# Patient Record
Sex: Female | Born: 1995 | Hispanic: Yes | Marital: Single | Smoking: Never smoker
Health system: Southern US, Community
[De-identification: ages and names within clinical notes are randomized; demographics above are authoritative.]

## PROBLEM LIST (undated history)

## (undated) ENCOUNTER — Inpatient Hospital Stay: Payer: Self-pay

## (undated) DIAGNOSIS — M419 Scoliosis, unspecified: Secondary | ICD-10-CM

## (undated) DIAGNOSIS — J45909 Unspecified asthma, uncomplicated: Secondary | ICD-10-CM

## (undated) HISTORY — PX: HEMORROIDECTOMY: SUR656

---
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
## 2016-03-18 ENCOUNTER — Other Ambulatory Visit (HOSPITAL_COMMUNITY): Payer: Self-pay | Admitting: Family Medicine

## 2016-03-18 ENCOUNTER — Other Ambulatory Visit: Payer: Self-pay | Admitting: Family Medicine

## 2016-03-18 DIAGNOSIS — Z3201 Encounter for pregnancy test, result positive: Secondary | ICD-10-CM

## 2016-03-29 ENCOUNTER — Inpatient Hospital Stay: Admission: RE | Admit: 2016-03-29 | Payer: Medicaid Other | Source: Ambulatory Visit

## 2016-03-29 ENCOUNTER — Ambulatory Visit
Admission: RE | Admit: 2016-03-29 | Discharge: 2016-03-29 | Disposition: A | Discharge status: Home / Self Care | Payer: Medicaid Other | Source: Ambulatory Visit | Attending: Family Medicine | Admitting: Family Medicine

## 2016-03-29 ENCOUNTER — Other Ambulatory Visit: Payer: Self-pay | Admitting: Family Medicine

## 2016-03-29 DIAGNOSIS — Z3687 Encounter for antenatal screening for uncertain dates: Secondary | ICD-10-CM | POA: Diagnosis not present

## 2016-03-29 DIAGNOSIS — Z3201 Encounter for pregnancy test, result positive: Secondary | ICD-10-CM | POA: Diagnosis present

## 2016-05-18 ENCOUNTER — Emergency Department: Payer: Medicaid Other

## 2016-05-18 ENCOUNTER — Emergency Department
Admission: EM | Admit: 2016-05-18 | Discharge: 2016-05-18 | Disposition: A | Discharge status: Home / Self Care | Payer: Medicaid Other | Attending: Student in an Organized Health Care Education/Training Program | Admitting: Student in an Organized Health Care Education/Training Program

## 2016-05-18 ENCOUNTER — Encounter: Payer: Self-pay | Admitting: Emergency Medicine

## 2016-05-18 DIAGNOSIS — R1030 Lower abdominal pain, unspecified: Secondary | ICD-10-CM | POA: Diagnosis not present

## 2016-05-18 DIAGNOSIS — M545 Low back pain, unspecified: Secondary | ICD-10-CM

## 2016-05-18 DIAGNOSIS — O26892 Other specified pregnancy related conditions, second trimester: Secondary | ICD-10-CM | POA: Diagnosis not present

## 2016-05-18 DIAGNOSIS — Z3A15 15 weeks gestation of pregnancy: Secondary | ICD-10-CM | POA: Diagnosis not present

## 2016-05-18 DIAGNOSIS — J45909 Unspecified asthma, uncomplicated: Secondary | ICD-10-CM | POA: Diagnosis not present

## 2016-05-18 DIAGNOSIS — R1031 Right lower quadrant pain: Secondary | ICD-10-CM

## 2016-05-18 DIAGNOSIS — R1032 Left lower quadrant pain: Secondary | ICD-10-CM

## 2016-05-18 DIAGNOSIS — R102 Pelvic and perineal pain: Secondary | ICD-10-CM

## 2016-05-18 LAB — BASIC METABOLIC PANEL
Anion gap: 6 (ref 5–15)
BUN: 8 mg/dL (ref 6–20)
CALCIUM: 8.9 mg/dL (ref 8.9–10.3)
CO2: 24 mmol/L (ref 22–32)
Chloride: 107 mmol/L (ref 101–111)
Creatinine, Ser: 0.38 mg/dL — ABNORMAL LOW (ref 0.44–1.00)
Glucose, Bld: 100 mg/dL — ABNORMAL HIGH (ref 65–99)
POTASSIUM: 3.6 mmol/L (ref 3.5–5.1)
Sodium: 137 mmol/L (ref 135–145)

## 2016-05-18 LAB — URINALYSIS, COMPLETE (UACMP) WITH MICROSCOPIC
Bilirubin Urine: NEGATIVE
Glucose, UA: NEGATIVE mg/dL
HGB URINE DIPSTICK: NEGATIVE
Ketones, ur: NEGATIVE mg/dL
NITRITE: NEGATIVE
PH: 7 (ref 5.0–8.0)
Protein, ur: NEGATIVE mg/dL
SPECIFIC GRAVITY, URINE: 1.004 — AB (ref 1.005–1.030)

## 2016-05-18 LAB — CBC WITH DIFFERENTIAL/PLATELET
BASOS ABS: 0.1 10*3/uL (ref 0–0.1)
BASOS PCT: 1 %
Eosinophils Absolute: 0.1 10*3/uL (ref 0–0.7)
Eosinophils Relative: 1 %
HEMATOCRIT: 35.4 % (ref 35.0–47.0)
Hemoglobin: 11.9 g/dL — ABNORMAL LOW (ref 12.0–16.0)
Lymphocytes Relative: 22 %
Lymphs Abs: 2.4 10*3/uL (ref 1.0–3.6)
MCH: 28.7 pg (ref 26.0–34.0)
MCHC: 33.7 g/dL (ref 32.0–36.0)
MCV: 85.3 fL (ref 80.0–100.0)
MONO ABS: 0.7 10*3/uL (ref 0.2–0.9)
Monocytes Relative: 6 %
NEUTROS ABS: 7.4 10*3/uL — AB (ref 1.4–6.5)
Neutrophils Relative %: 70 %
PLATELETS: 187 10*3/uL (ref 150–440)
RBC: 4.14 MIL/uL (ref 3.80–5.20)
RDW: 15 % — ABNORMAL HIGH (ref 11.5–14.5)
WBC: 10.6 10*3/uL (ref 3.6–11.0)

## 2016-05-18 LAB — ABO/RH: ABO/RH(D): A POS

## 2016-05-18 MED ORDER — NITROFURANTOIN MONOHYD MACRO 100 MG PO CAPS: 100.0000 mg | ORAL_CAPSULE | Freq: Two times a day (BID) | ORAL | 0 refills | Status: AC

## 2016-05-18 MED ORDER — ACETAMINOPHEN 500 MG PO TABS
1000.0000 mg | ORAL_TABLET | Freq: Once | ORAL | Status: AC
  Administered 2016-05-18: 1000 mg via ORAL
  Filled 2016-05-18: qty 2

## 2016-05-18 NOTE — ED Notes (Addendum)
Pt back to room. Pt ambulatory and up to restroom at this time. No acute distress at this time.

## 2016-05-18 NOTE — ED Notes (Signed)
Pt taken to radiology. Pt then taken from radiology to ultrasound. RN unable to draw blood work before pt taken from floor.
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
## 2016-05-18 NOTE — ED Notes (Signed)
Pt able to ambulate without difficulty and without needed assistance. Pt verbalized feeling pressure in lower back but denies pain or abd pain at this time. Pt currently drinking fluids per MD order.

## 2016-05-18 NOTE — ED Notes (Signed)
Pt verbalized understanding of discharge and reports having called a ride. Pt able to ambulate independently and verbalized she would wait for ride in lobby. Pt in NAD at time of discharge.

## 2016-05-18 NOTE — ED Notes (Signed)
MD at bedside with ultrasound

## 2016-05-27 ENCOUNTER — Other Ambulatory Visit: Payer: Self-pay | Admitting: Family Medicine

## 2016-05-27 DIAGNOSIS — Z3689 Encounter for other specified antenatal screening: Secondary | ICD-10-CM

## 2016-06-02 ENCOUNTER — Ambulatory Visit
Admission: RE | Admit: 2016-06-02 | Discharge: 2016-06-02 | Disposition: A | Discharge status: Home / Self Care | Payer: Medicaid Other | Source: Ambulatory Visit | Attending: Family Medicine | Admitting: Family Medicine

## 2016-06-02 ENCOUNTER — Ambulatory Visit
Admission: RE | Admit: 2016-06-02 | Discharge: 2016-06-02 | Disposition: A | Discharge status: Home / Self Care | Payer: Medicaid Other | Source: Ambulatory Visit | Attending: Obstetrics & Gynecology | Admitting: Obstetrics & Gynecology

## 2016-06-02 ENCOUNTER — Other Ambulatory Visit: Payer: Self-pay | Admitting: *Deleted

## 2016-06-02 ENCOUNTER — Encounter: Payer: Self-pay | Admitting: *Deleted

## 2016-06-02 DIAGNOSIS — Z3689 Encounter for other specified antenatal screening: Secondary | ICD-10-CM | POA: Diagnosis not present

## 2016-06-02 DIAGNOSIS — Z3A17 17 weeks gestation of pregnancy: Secondary | ICD-10-CM | POA: Diagnosis not present

## 2016-06-02 DIAGNOSIS — Z36 Encounter for antenatal screening for chromosomal anomalies: Secondary | ICD-10-CM | POA: Diagnosis not present

## 2016-06-02 NOTE — Progress Notes (Addendum)
Referring Provider:  [facility] Length of Consultation: 40 minutes   Mary Kelley was referred to [facility] for genetic counseling and targeted ultrasound because of an increased risk for Down syndrome by the maternal serum prenatal screen.  This note summarizes the information we discussed.   We provided background information on the maternal serum prenatal screen.  It was explained that this is a maternal blood test performed between the 14th and 21st week of pregnancy which measures several pregnancy proteins.  The levels of these proteins can help determine if a pregnancy is at high risk for certain birth defects or problems.  However, it cannot diagnose or rule out these defects.  An abnormal maternal serum screen does not necessarily mean that the baby has a problem.  Maternal serum screening can identify approximately 80% of neural tube defects, up to 75% of Down syndrome cases and 60% of trisomy 18 cases.  The neural tube consists of the fetal head and spine and if this structure fails to close during development, spina bifida (open spine) or anencephaly (open skull) could result.  Chromosomes are the inherited structures that contain our instructions for development (genes).  Each cell in our body normally has 46 chromosomes.  Rarely, when an egg and sperm unite, an extra or missing chromosome can be passed on to the baby by mistake.  These types of chromosome problems typically cause mental retardation and might also cause birth defects.  We discussed Down syndrome (an extra chromosome #21) and trisomy 52 (an extra chromosome #18).    The maternal serum screen revealed protein levels that increased the chance of Down syndrome (trisomy 21) in the pregnancy.  Before maternal serum screening, the age-related chance of Trisomy 21 in the pregnancy was 1 in 1,180.  Given the maternal serum screen results, the chance is now estimated to be 1 in 59.  This  means that the chance the baby does not have Down syndrome is greater than 98 percent.  The following prenatal testing options for this pregnancy:  Targeted ultrasound uses high frequency sound waves to create an image of the developing fetus.  An ultrasound is often recommended as a routine means of evaluating the pregnancy.  It is also used to screen for fetal anatomy problems (for example, a heart defect) that might be suggestive of a chromosomal or other abnormality.    Amniocentesis involves the removal of a small amount of amniotic fluid from the sac surrounding the fetus with the use of a thin needle inserted through the maternal abdomen and uterus.  Ultrasound guidance is used throughout the procedure.  Fetal cells are directly evaluated and > 98% of neural tube defects can be detected.  The main risks to this procedure include complications leading to miscarriage in less than 1 in 200 cases (0.5%).    We also reviewed the availability of cell free fetal DNA testing from maternal blood to determine whether or not the baby may have Down syndrome, trisomy 44, or trisomy 64.  This test utilizes a maternal blood sample and DNA sequencing technology to isolate circulating cell free fetal DNA from maternal plasma.  The fetal DNA can then be analyzed for DNA sequences that are derived from the three most common chromosomes involved in aneuploidy, chromosomes 13, 18, and 21.  If the overall amount of DNA is greater than the expected level for any of these chromosomes, aneuploidy is suspected.  While we do not consider it a replacement  for invasive testing and karyotype analysis, a negative result from this testing would be reassuring, though not a guarantee of a normal chromosome complement for the baby.  An abnormal result is certainly suggestive of an abnormal chromosome complement, though we would still recommend CVS or amniocentesis to confirm any findings from this testing.  Cystic Fibrosis and Spinal  Muscular Atrophy (SMA) screening were also discussed with the patient. Both conditions are recessive, which means that both parents must be carriers in order to have a child with the disease.  Cystic fibrosis (CF) is one of the most common genetic conditions in persons of Caucasian ancestry.  This condition occurs in approximately 1 in 2,500 Caucasian persons and results in thickened secretions in the lungs, digestive, and reproductive systems.  For a baby to be at risk for having CF, both of the parents must be carriers for this condition.  Approximately 1 in 2025 Caucasian persons is a carrier for CF.  Current carrier testing looks for the most common mutations in the gene for CF and can detect approximately 90% of carriers in the Caucasian population.  This means that the carrier screening can greatly reduce, but cannot eliminate, the chance for an individual to have a child with CF.  If an individual is found to be a carrier for CF, then carrier testing would be available for the partner. As part of [place] newborn screening profile, all babies born in the state of [place]  will have a two-tier screening process.  Specimens are first tested to determine the concentration of immunoreactive trypsinogen (IRT).  The top 5% of specimens with the highest IRT values then undergo DNA testing using a panel of over 40 common CF mutations. SMA is a neurodegenerative disorder that leads to atrophy of skeletal muscle and overall weakness.  This condition is also more prevalent in the Caucasian population, with 1 in 40-1 in 60 persons being a carrier and 1 in 6,000-1 in 10,000 children being affected.  There are multiple forms of the disease, with some causing death in infancy to other forms with survival into adulthood.  The genetics of SMA is complex, but carrier screening can detect up to 95% of carriers in the Caucasian population.  Similar to CF, a negative result can greatly reduce, but cannot eliminate,  the chance to have a child with SMA.  We obtained a detailed family history and pregnancy history.  The family history is unremarkable for birth defects, developmental delays, recurrent pregnancy loss or known chromosome abnormalities.  Mary Kelley stated that this is her second pregnancy.  She reported that she had a 13 week miscarriage in her first pregnancy, after having had lots of bleeding throughout the pregnancy.  No genetic testing was performed in that pregnancy.  In the current pregnancy, she reported no complications or exposures other than alcohol use prior to learning that she was pregnant.  She stated that she was drinking about 5 beers one day per week.  Alcohol consumption during pregnancy has been associated with a number of birth defects including growth delays, small head size, heart defects, eye anomalies and facial differences as well as learning disabilities and behavioral problems.  The risk of these to occur tends to increase with the amount of alcohol consumed, however, malformations have been seen with as little as two drinks per day.  Because there is no safe amount of alcohol consumption during pregnancy, we suggest women completely avoid alcohol while pregnant. Because she stated that she stopped  very early in the pregnancy, around 4 weeks, it was likely that this exposure was during the all or none period and would have a low risk for fetal effects.  After consideration of the options, Mary Kelley elected to proceed with cell free fetal DNA testing and a detailed ultrasound.    She declined CF and SMA carrier screening.  An ultrasound was performed at the time of the visit.  Dating was consistent with 17 weeks.  The detailed fetal anatomy was seen and appeared normal except for the heart and spine, which were suboptimally seen.  She will return in 3 weeks to complete the anatomy.  We would also recommend a third trimester ultrasound for growth, as the hCG level was greater  than 2.5 MoM.  The patient was encouraged to call with questions or concerns. We can be contacted at [phone].    Mary Anderson, MS, CGC  Mariaeduarda Defranco, [place] A, MD

## 2016-06-09 ENCOUNTER — Ambulatory Visit: Payer: No Typology Code available for payment source

## 2016-06-10 LAB — INFORMASEQ(SM) WITH XY ANALYSIS
Fetal Fraction (%):: 11.1
GESTATIONAL AGE AT COLLECTION: 18 wk

## 2016-06-13 ENCOUNTER — Telehealth: Payer: Self-pay | Admitting: Obstetrics and Gynecology

## 2016-06-13 NOTE — Telephone Encounter (Signed)
Ms. Mary Kelley elected to have InformaSeq cell free DNA testing on 06/02/16 due to an abnormal maternal serum screening result with a 1 in 59 chance for Down syndrome.  The patient was informed of the results of her recent InformaSeq testing (performed at [facility]) which yielded NEGATIVE results.  The patient's specimen showed DNA consistent with two copies of chromosomes 21, 18 and 13.  The sensitivity for trisomy 5521, trisomy 6518 and trisomy 4013 using this testing are reported as 99.1%, 98.3% and 98.1% respectively.  Thus, while the results of this testing are highly accurate, they are not considered diagnostic at this time.  Should more definitive information be desired, the patient may still consider amniocentesis.   As requested to know by the patient, sex chromosome analysis was included for this sample.  Results were consistent with a female fetus (XY). This is predicted with >97% accuracy.  Because the hCG was greater than 2.5 MoM, we would recommend an ultrasound for growth in the third trimester.  Cherly Andersoneborah F. Tzivia Oneil, MS, CGC

## 2016-06-20 ENCOUNTER — Other Ambulatory Visit: Payer: Medicaid Other

## 2016-06-20 ENCOUNTER — Other Ambulatory Visit: Payer: Self-pay

## 2016-06-20 DIAGNOSIS — IMO0002 Reserved for concepts with insufficient information to code with codable children: Secondary | ICD-10-CM

## 2016-06-20 DIAGNOSIS — Z0489 Encounter for examination and observation for other specified reasons: Secondary | ICD-10-CM

## 2016-06-23 ENCOUNTER — Ambulatory Visit
Admission: RE | Admit: 2016-06-23 | Discharge: 2016-06-23 | Disposition: A | Discharge status: Home / Self Care | Payer: Medicaid Other | Source: Ambulatory Visit | Attending: Maternal and Fetal Medicine | Admitting: Maternal and Fetal Medicine

## 2016-06-23 DIAGNOSIS — IMO0002 Reserved for concepts with insufficient information to code with codable children: Secondary | ICD-10-CM

## 2016-06-23 DIAGNOSIS — Z362 Encounter for other antenatal screening follow-up: Secondary | ICD-10-CM | POA: Diagnosis present

## 2016-06-23 DIAGNOSIS — Z0489 Encounter for examination and observation for other specified reasons: Secondary | ICD-10-CM

## 2016-08-08 ENCOUNTER — Observation Stay
Admission: EM | Admit: 2016-08-08 | Discharge: 2016-08-08 | Disposition: A | Discharge status: Home / Self Care | Payer: Medicaid Other | Attending: Obstetrics & Gynecology | Admitting: Obstetrics & Gynecology

## 2016-08-08 DIAGNOSIS — O368120 Decreased fetal movements, second trimester, not applicable or unspecified: Principal | ICD-10-CM | POA: Insufficient documentation

## 2016-08-08 DIAGNOSIS — Z3A26 26 weeks gestation of pregnancy: Secondary | ICD-10-CM | POA: Insufficient documentation

## 2016-08-08 NOTE — Procedures (Addendum)
Bedside ultrasound:  21yo G2P0010 @ 26w+4d with history of 17week loss with decreased fetal movement x <12hours. LMP: 02/03/16 EDC: 11/09/16  Ultrasound findings:  Fundal/posterior placenta, intact, distinct border to uterus. Grossly normal fluid, with DVP 6cm, more than one 2x2pocket. Cephalic presentation Normal appearing cardiac motion  During exam, there was >3 gross movements and flexion/extension, as well as >30sec continuous breathing motion throughout.  BPP result:  8/8 sonographic findings.   Patient did not register all fetal movements.  ----- Mary Plumberhelsea Miski Feldpausch, MD Attending Obstetrician and Gynecologist [facility], Department of OB/GYN [facility]

## 2016-08-08 NOTE — Discharge Summary (Signed)
Mary SladeDalia Bradly Kelley is a 21 y.o. female. She is at 6530w4d gestation. Patient's last menstrual period was 02/03/2016. Estimated Date of Delivery: 11/09/16  [facility]  Chief complaint: decreased fetal movement  Location: gravid uterus Onset/timing: 8am today  Duration: 10 hours Quality:  Decreased movement Severity: moderate Aggravating or alleviating conditions: n/a Associated signs/symptoms: no CTX, no VB.no LOF Context:  Patient has history of 17week loss and is anxious about this pregnancy.  She had been feeling normal, routine, and predictable movements, which have not disappeared, but are not typical today.  Denies trauma, any illness, loss of fluid.   Maternal Medical History:   Past Medical History:  Diagnosis Date  . Asthma     No past surgical history on file.  No Known Allergies  Prior to Admission medications   Medication Sig Start Date End Date Taking? Authorizing Provider  Prenatal Vit-Fe Fumarate-FA (MULTIVITAMIN-PRENATAL) 27-0.8 MG TABS tablet Take 1 tablet by mouth daily at 12 noon.    [provider]     Social History: She  reports that she has never smoked. She has never used smokeless tobacco. She reports that she does not drink alcohol or use drugs.  Family History: no history of gyn cancers  Review of Systems: A full review of systems was performed and negative except as noted in the HPI.     O:  Temp 98.6 F (37 C) (Oral)   Resp 18   Ht 5\' 5"  (1.651 m)   Wt 64.9 kg (143 lb)   LMP 02/03/2016   BMI 23.80 kg/m  No results found for this or any previous visit (from the past 48 hour(s)).   Constitutional: NAD, AAOx3  HE/ENT: extraocular movements grossly intact, moist mucous membranes CV: RRR PULM: nl respiratory effort, CTABL     Abd: gravid, non-tender, non-distended, soft      Ext: Non-tender, Nonedmeatous   Psych: mood appropriate, speech normal Pelvic deferred  NST:  Baseline: 140 Variability:  moderate Accelerations present x >2 (15x15) Decelerations absent Time 20mins    A/P: 21 y.o. 6630w4d here for decreased fetal movement  Labor: not present.   Fetal Wellbeing: Reassuring Cat 1 tracing.  Reactive NST, 10/10 BPP  Reassurance given.  D/c home stable, precautions reviewed, follow-up as scheduled.   ----- Ranae Plumberhelsea Ward, MD Attending Obstetrician and Gynecologist [facility], Department of OB/GYN [facility]

## 2016-08-08 NOTE — OB Triage Note (Signed)
Patient presents with c/o no fetal movement since 0800 today.  Denies any vaginal bleeding/spotting.  Denies any cramping or contractions.  No s/s srom.  Dr. Elesa MassedWard at bedside, bedside U/s performed.  Confirmed stable fhr and movement.  efm and toco appled.  Abdomen soft, non-tender.

## 2017-02-01 ENCOUNTER — Encounter (HOSPITAL_COMMUNITY): Payer: Self-pay

## 2017-02-13 ENCOUNTER — Ambulatory Visit: Payer: Self-pay | Attending: Family Medicine

## 2017-02-13 ENCOUNTER — Other Ambulatory Visit: Payer: Self-pay

## 2017-02-13 DIAGNOSIS — M62838 Other muscle spasm: Secondary | ICD-10-CM | POA: Insufficient documentation

## 2017-02-13 DIAGNOSIS — R293 Abnormal posture: Secondary | ICD-10-CM | POA: Insufficient documentation

## 2017-02-13 DIAGNOSIS — M6281 Muscle weakness (generalized): Secondary | ICD-10-CM | POA: Insufficient documentation

## 2017-02-13 NOTE — Therapy (Signed)
[place] [facility] MAIN [facility] SERVICES [address] [place], [place], [address] Phone: [phone]   Fax:  [phone]  Physical Therapy Evaluation  Patient Details  Name: Mary Kelley MRN: [id number] Date of Birth: 1995-09-11 Referring Provider: Berenda Morale   Encounter Date: 02/13/2017  PT End of Session - 02/13/17 1647    Visit Number  1    Number of Visits  4    Date for PT Re-Evaluation  04/10/17    Authorization Type  self-pay    Authorization - Visit Number  1    Authorization - Number of Visits  4    PT Start Time  1402    PT Stop Time  1504    PT Time Calculation (min)  62 min    Activity Tolerance  Patient tolerated treatment well    Behavior During Therapy  [facility] for tasks assessed/performed       Past Medical History:  Diagnosis Date  . Asthma     History reviewed. No pertinent surgical history.  There were no vitals filed for this visit.          Pelvic Floor Physical Therapy Evaluation and Assessment  SUBJECTIVE  Patient reports: Pain started when she was about 6 months pregnant (May 2018). The pain was in abdomen and pelvis. Pain is now only in the pelvis during intercourse and when standing for long durations. 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 Wanted to get an IUD but unable due to pain with use of speculum so is using depo-provera. Pain is not allowing her to have intercourse since she was ~ 6 mo. Pregnant. Has scoliosis.  Social/Family/Vocational History:   Lives with son who is 19 months old.   Obstetrical History: G2P1011 Had vaginal tearing with delivery.   Gynecological History: None relevant  Urinary History: No difficulty with urination, no pain.  Urinates every 3-4 hours Drinks ~ 3 12 oz. Water bottles per day.   Gastrointestinal History: No issues  Sexual activity/pain: Pain with initial  penetration and deep thrusting. It does not linger after intercourse  Location of pain: at opening and inside of the vagina Current pain:  0/10  Max pain:  8/10 Least pain:  0/10 Nature of pain: sharp, tight, uncomfortable   Patient Goals: "Get rid of pain."   OBJECTIVE  Posture/Observations:  Sitting: forward rounded protective posture Standing: mild lordosis and forward head.  Palpation/Segmental Motion/Joint Play: TTP through adductors on R only iliac crest high on R, L ASIS low, L facing pelvis. No TTP through paraspinals Special tests:   Stork positive for instability on L, negative on R Scoliosis forward bend: R thoracic, L lumbar curve, mild.   Range of Motion/Flexibilty:  Spine:  Hips:    Strength/MMT:  LE MMT  LE MMT Left Right  Hip flex:  (L2) /5 /5  Hip ext: 4+/5 4+/5  Hip abd: 4+/5 4+/5  Hip add: 4+/5 4+/5  Hip IR 4/5 4/5  Hip ER 4+/5 4+/5     Abdominal:  Palpation: no TTP Diastasis: 1 finger below sternum, 2 fingers at umbilicus and below.  Pelvic Floor External Exam: Introitus Appears: gaping Skin integrity: incomplete closure of the posterior fourchette after tearing with childbirth, mild bleeding (pt. Reports from depo) Palpation: TTP through all of superficial layer on R only Cough: paradoxical Prolapse visible?: no Scar mobility: unable to assess d/t pain  Internal Vaginal Exam: Strength (  PERF): 2/5 1 second hold.  Symmetry: R side with increased tone. Palpation: TTP throughout internally on R, OI only on the L Prolapse: uterus visible above level of introitus with bearing down   Gait Analysis: Not assessed   Pelvic Floor Outcome Measures: Female NIH-CPSI: 25/43 (58%)          Objective measurements completed on examination: See above findings.              PT Education - 02/13/17 1646    Education provided  Yes    Education Details  educated on the structure and function of the pelvic floor, the POC, and  breathing techniques to decrease pain and lengthent the pelvic floor.    Person(s) Educated  Patient    Methods  Explanation;Demonstration;Tactile cues;Verbal cues    Comprehension  Verbalized understanding;Returned demonstration       PT Short Term Goals - 02/13/17 0927      PT SHORT TERM GOAL #1   Title  Patient will report a reduction of pain by at least 50% to light touch to allow for self manual therapy and demonstrate decreased tissue sensitivity    Time  4    Period  Weeks    Status  New    Target Date  03/14/17      PT SHORT TERM GOAL #2   Title  Patient will demonstrate a coordinated contraction, relaxation, and bulge of the pelvic floor muscles to demonstrate functional recruitment and motion and allow for further strengthening.    Time  4    Period  Weeks    Status  New    Target Date  03/14/17      PT SHORT TERM GOAL #3   Title  Patient will demonstrate appropriate body mechanics with bending and lifting heavy objects to allow for decreased stress on the pelvic floor and low back.     Time  4    Period  Weeks    Status  New    Target Date  03/14/17        PT Long Term Goals - 02/14/17 0932      PT LONG TERM GOAL #1   Title  Patient will report no pain with intercourse to demonstrate improved functional ability.    Time  8    Period  Weeks    Status  New    Target Date  04/11/17      PT LONG TERM GOAL #2   Title  Patient will score less than or equal to 20% on the Female NIH-CPSI to demonstrate a reduction in pain, urinary symptoms, and an improved quality of life.    Baseline  58%     Time  8    Period  Weeks    Status  New    Target Date  04/11/17      PT LONG TERM GOAL #3   Title  Patient will demonstrate 3+/5 strength or better in the PF and surrounding musculature to show improved functional strength for childcare and other vocational and recreational activities.     Baseline  2/5    Time  8    Period  Weeks    Status  New    Target Date   04/11/17             Plan - 02/13/17 1649    Clinical Impression Statement  Patient is a 21 y/o female who is 3 mo. postpartum and presents with cheif c/o pelvic  pain during standing, to palpation, and with intercourse that is limiting her functional activity and does not allow for gynecological exams or treatment without intense pain. The patient will benefit from skilled physical therapy to address muscular imbalance, spasm, weakness, and poor knowledge of appropriate posture and body mechanics to decrease pain and improve her functional ability.    History and Personal Factors relevant to plan of care:  Patient gave birth on August 17th 2018 to her first child, she lives alone, works full-time and studies full-time.     Clinical Presentation  Stable    Clinical Decision Making  Moderate    Rehab Potential  Good    PT Frequency  Other (comment) bimonthly    PT Duration  8 weeks    PT Treatment/Interventions  ADLs/Self Care Home Management;Neuromuscular re-education;Gait training;Passive range of motion;Manual techniques;Functional mobility training;Moist Heat;Traction;Therapeutic activities;Patient/family education;Taping;Therapeutic exercise;Scar mobilization    PT Next Visit Plan  address hip imbalance,  functional mobility education, give postnatal handout    PT Home Exercise Plan  plank (on L), rotations to L, adductor stretch,     Consulted and Agree with Plan of Care  Patient       Patient will benefit from skilled therapeutic intervention in order to improve the following deficits and impairments:  Increased fascial restricitons, Pain, Improper body mechanics, Decreased coordination, Decreased scar mobility, Increased muscle spasms, Postural dysfunction, Decreased strength  Visit Diagnosis: Other muscle spasm  Muscle weakness (generalized)  Abnormal posture     Problem List Patient Active Problem List   Diagnosis Date Noted  . Labor and delivery indication for care or  intervention 08/08/2016  . Risk of fetal chromosome anomaly affecting antepartum care of mother   . Preterm delivery, delivered 08/05/2015  . Retained products of conception after delivery without hemorrhage 08/05/2015   Cleophus MoltKeeli T. Gailes DPT, ATC  Cleophus MoltKeeli T Gailes 02/14/2017, 10:18 AM  [facility] [address] [place], [place], [address] Phone: [phone]   Fax:  [phone]  Name: Mary PinaDalia Kelley MRN: [id number] Date of Birth: [birth year]

## 2017-02-14 NOTE — Patient Instructions (Signed)
1.) Perform deep belly-breathing to help decrease spasm and lower pain.

## 2017-03-13 ENCOUNTER — Ambulatory Visit: Payer: Self-pay

## 2017-03-27 ENCOUNTER — Ambulatory Visit: Payer: Self-pay | Attending: Family Medicine

## 2017-12-17 IMAGING — US US OB COMP LESS 14 WK
1 series · 14 of 28 positions shown · non-contrast
Comparison: Pelvic ultrasound 06/21/2015

CLINICAL DATA: Assess viability.  Vaginal bleeding.

EXAM:
OBSTETRIC <14 WK US AND TRANSVAGINAL OB US
TECHNIQUE: Both transabdominal and transvaginal ultrasound examinations were
performed for complete evaluation of the gestation as well as the
maternal uterus, adnexal regions, and pelvic cul-de-sac.
Transvaginal technique was performed to assess early pregnancy.

[Series 1: us ob comp less 14 wk · 0.22mm/px · 14 of 43 slices shown]
[im 2/43]
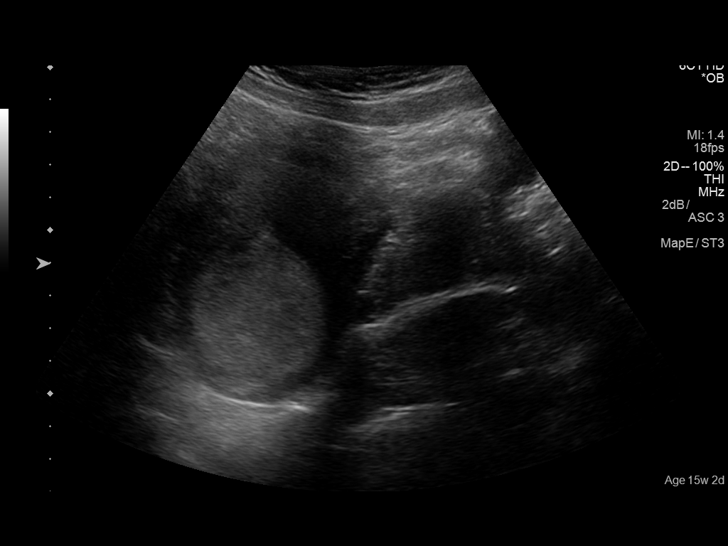
[im 5/43]
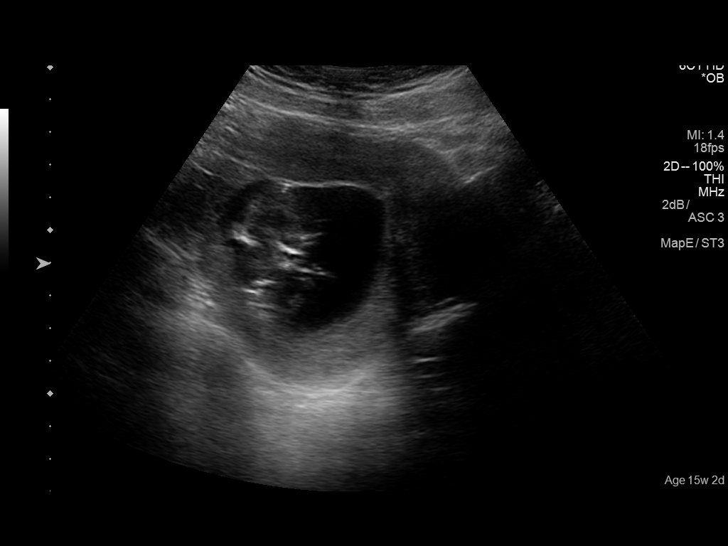
[im 8/43]
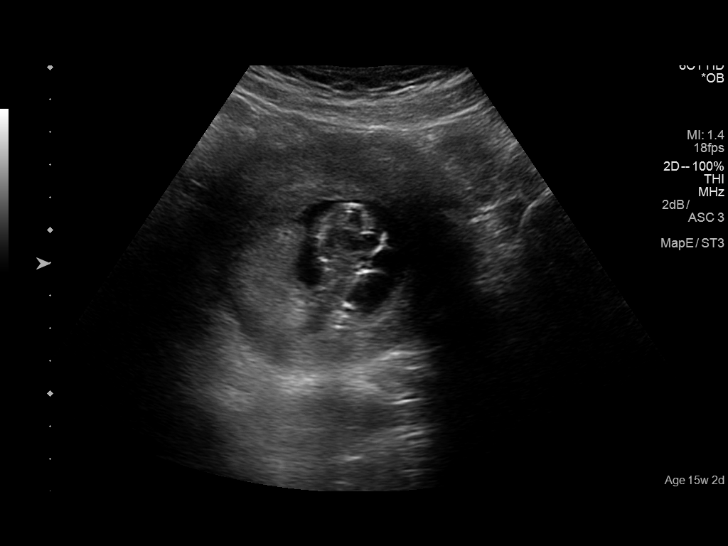
[im 11/43]
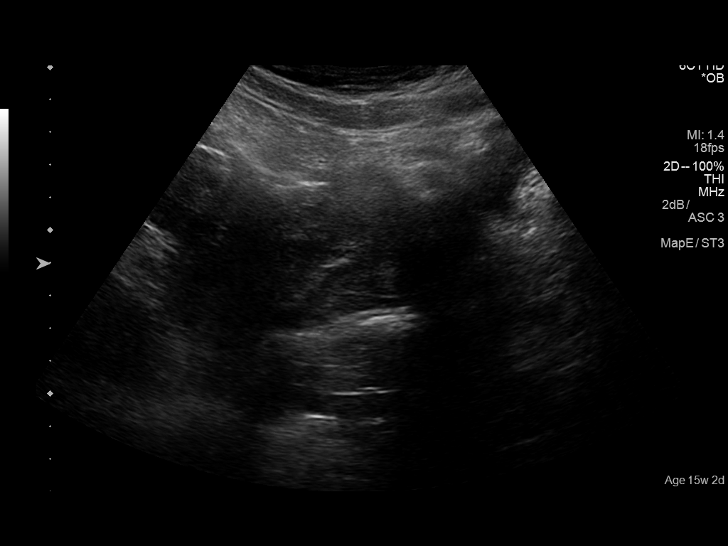
[im 15/43]
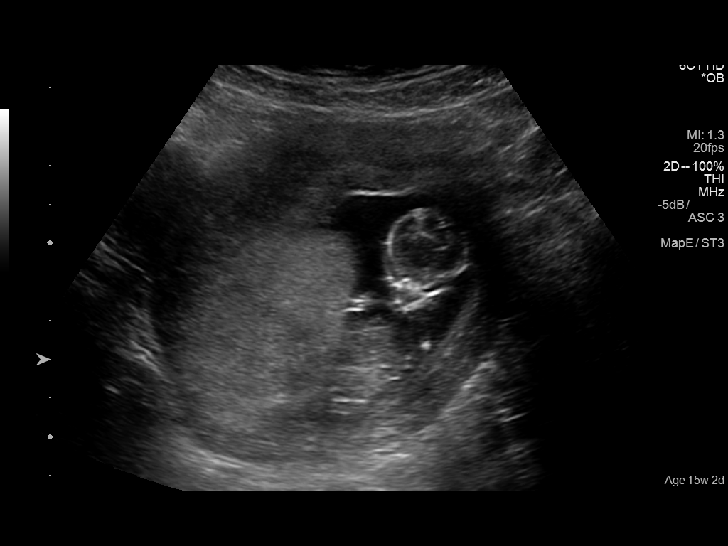
[im 18/43]
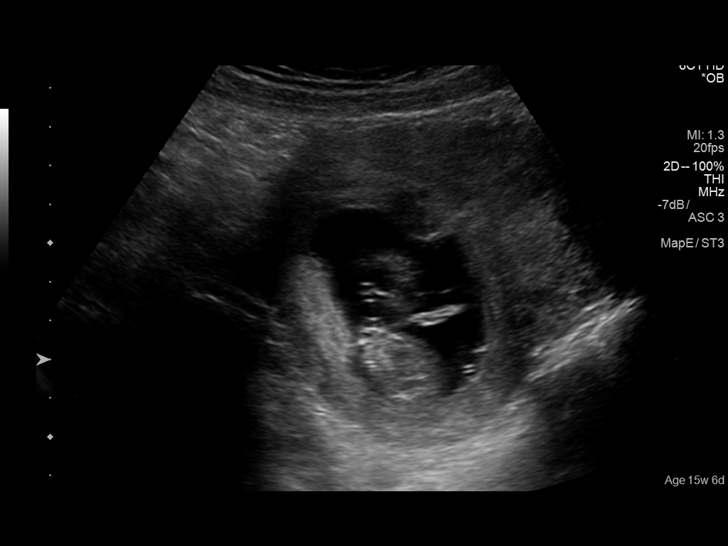
[im 21/43]
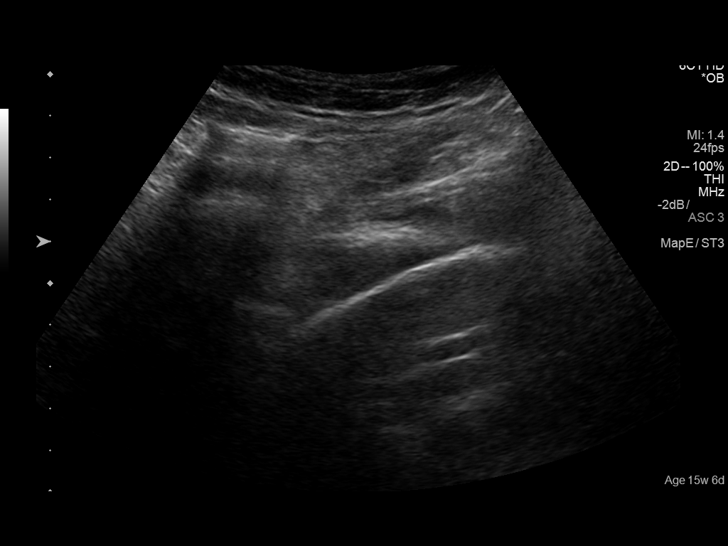
[im 24/43]
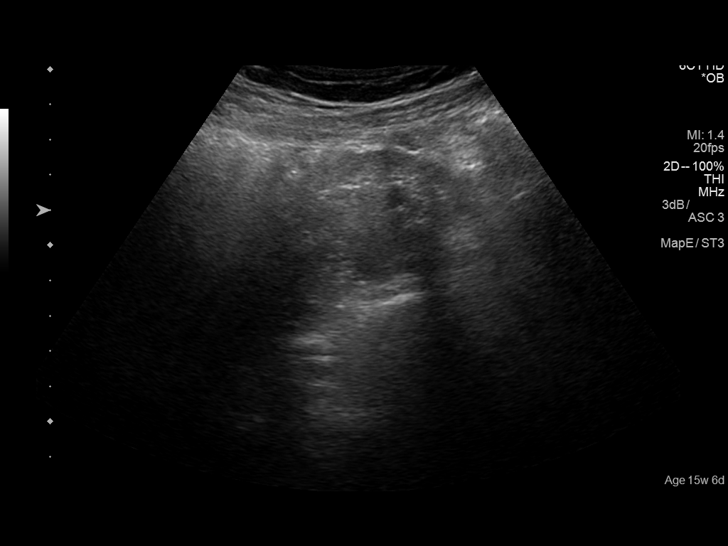
[im 27/43]
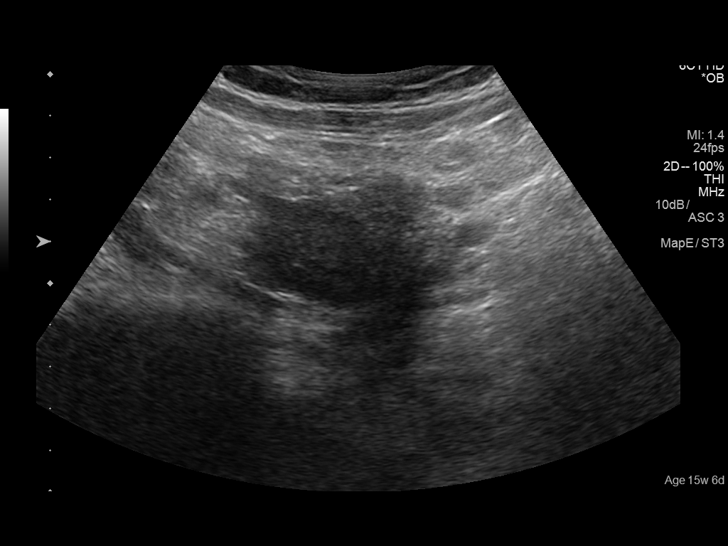
[im 30/43]
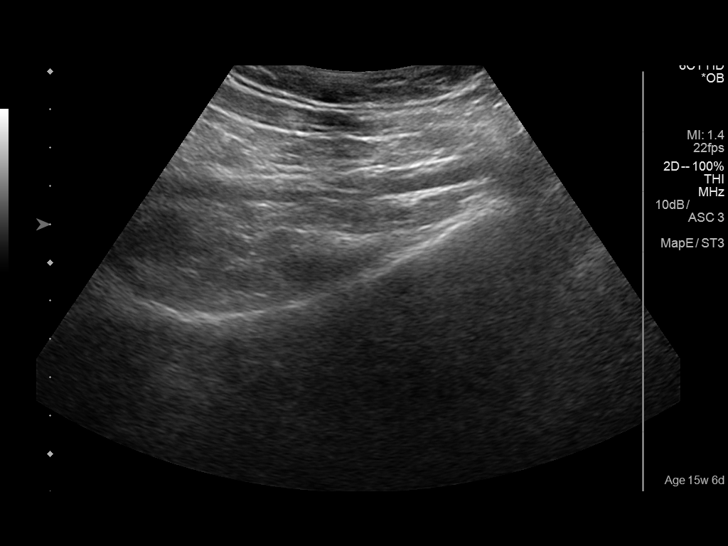
[im 33/43]
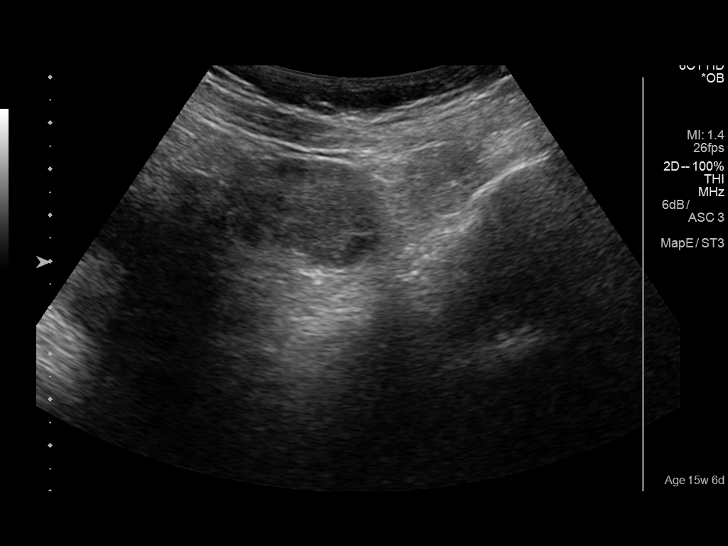
[im 36/43]
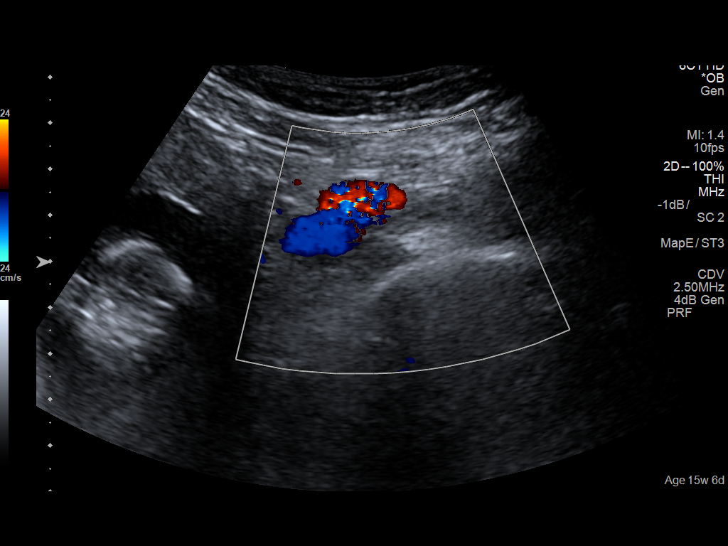
[im 39/43]
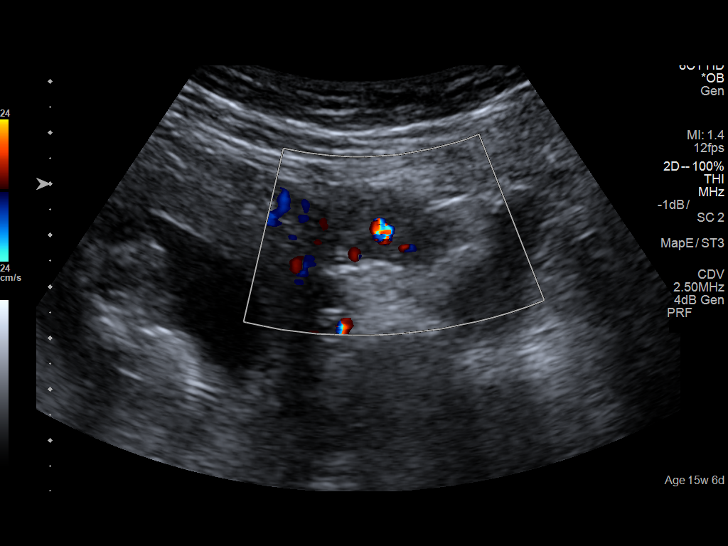
[im 43/43]
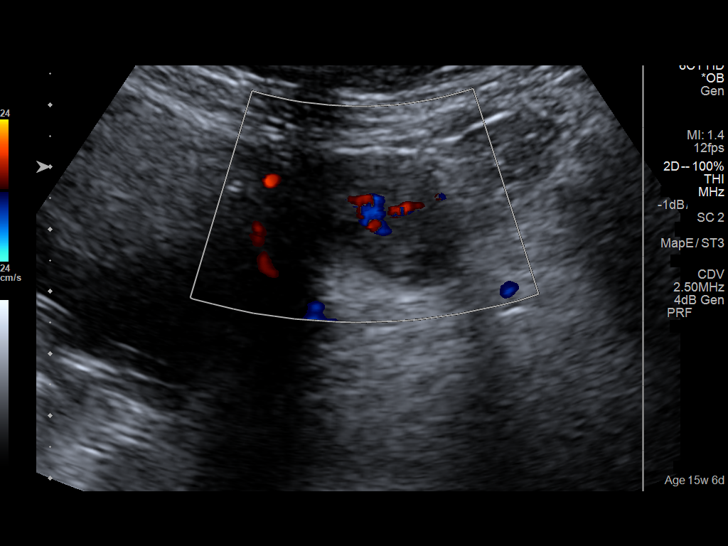

[14 of 28 positions shown; findings below may reference images not displayed]

FINDINGS: Intrauterine gestational sac: Single

Yolk sac:  Not visualized

Embryo:  Present

Cardiac Activity: Present

Heart Rate: 162  bpm

CRL:  53.7  mm   12 w   0 d                  US EDC: 02/05/2016

Subchorionic hemorrhage:  None visualized.

Maternal uterus/adnexae: Normal right and left ovaries. No free
fluid in the pelvis.
IMPRESSION: Single live intrauterine gestation.
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
## 2018-11-17 IMAGING — US US MFM OB FOLLOW-UP
1 series · 13 of 27 positions shown · non-contrast
Comparison: none

PATIENT INFO:

PERFORMED BY:
SERVICE(S) PROVIDED:
INDICATIONS:
20 weeks gestation of pregnancy
Complete anatomy scan
FETAL EVALUATION:
Num Of Fetuses:     1
Fetal Heart         163
Rate(bpm):
Cardiac Activity:   Present
Presentation:       Cephalic
Placenta:           Fundal
GESTATIONAL AGE:
LMP:           20w 1d        Date:  02/03/16                 EDD:   11/09/16
Best:          20w 1d     Det. By:  LMP  (02/03/16)          EDD:   11/09/16
ANATOMY:
Heart:                 4-Chamber view         Stomach:                Normal appearance
appears normal
RVOT:                  Normal appearance      Kidneys:                Normal appearance
LVOT:                  Normal appearance      Bladder:                Normal appearance
Aortic Arch:           Normal appearance      Spine:                  Normal appearance
Ductal Arch:           Normal appearance

[Series 1: us mfm ob follow-up · 0.20mm/px · 27 acquisitions, 13 frames shown]
[im 2/27]
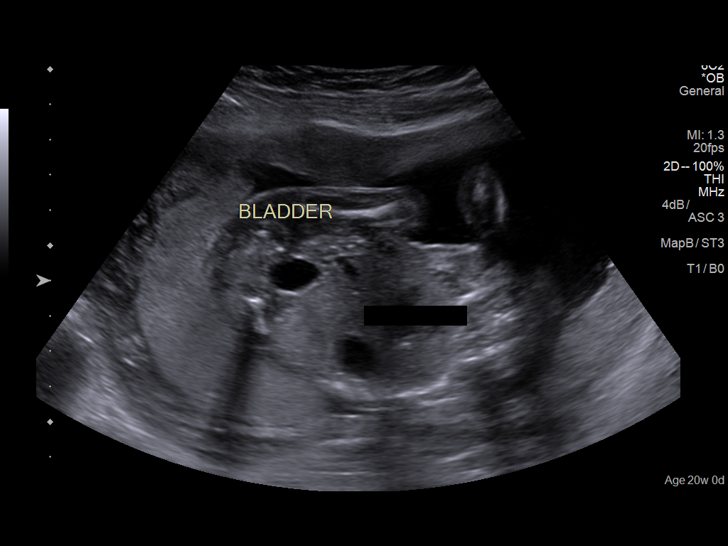
[im 4/27]
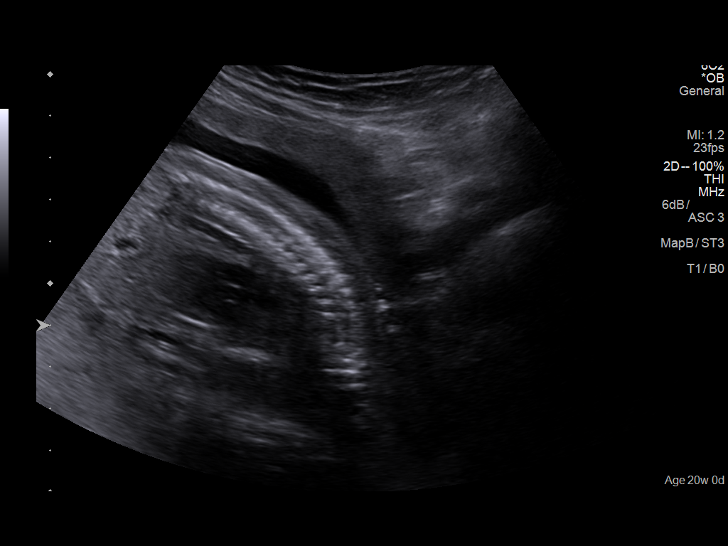
[im 6/27]
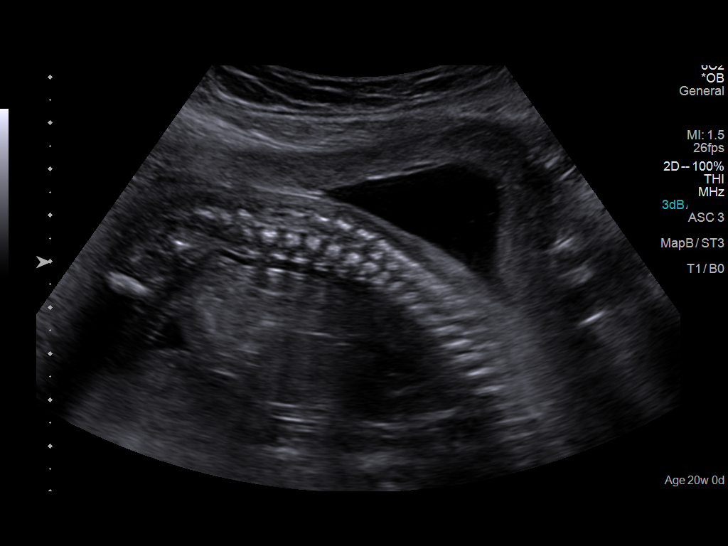
[im 8/27]
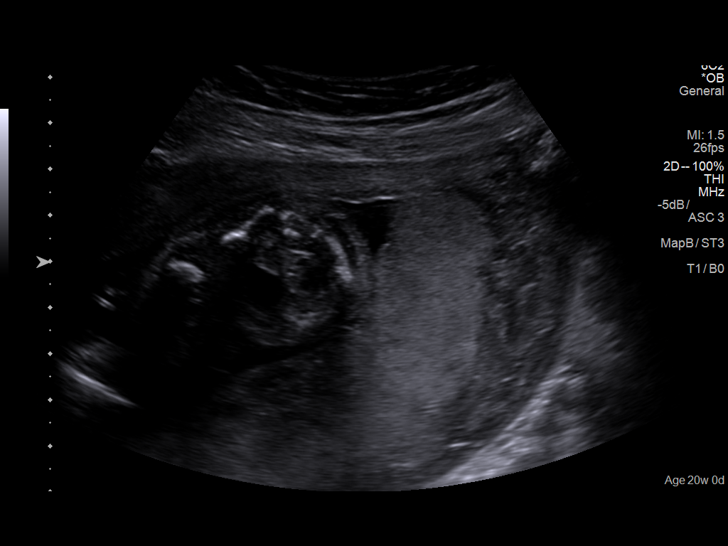
[im 10/27]
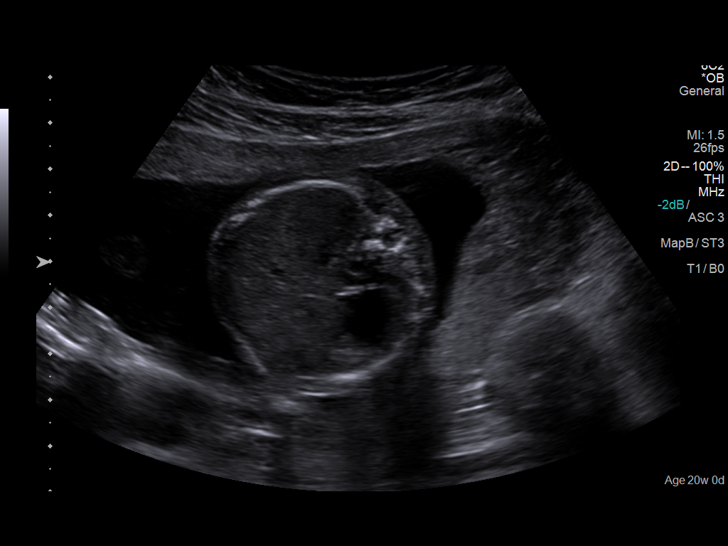
[im 12/27]
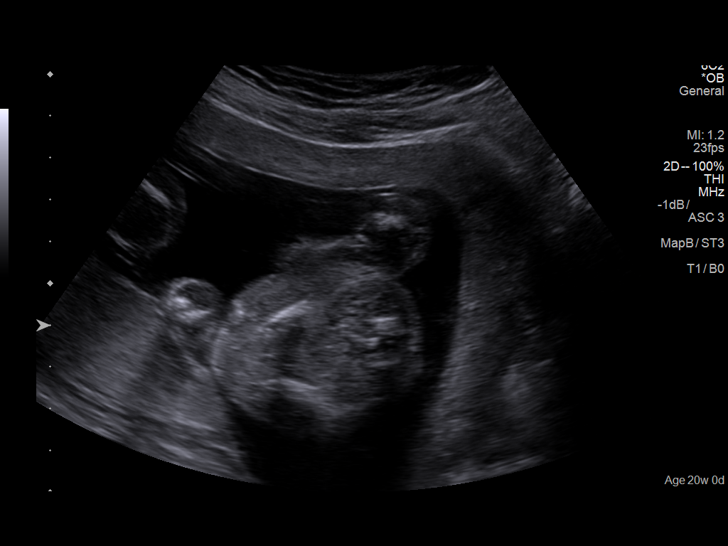
[im 14/27]
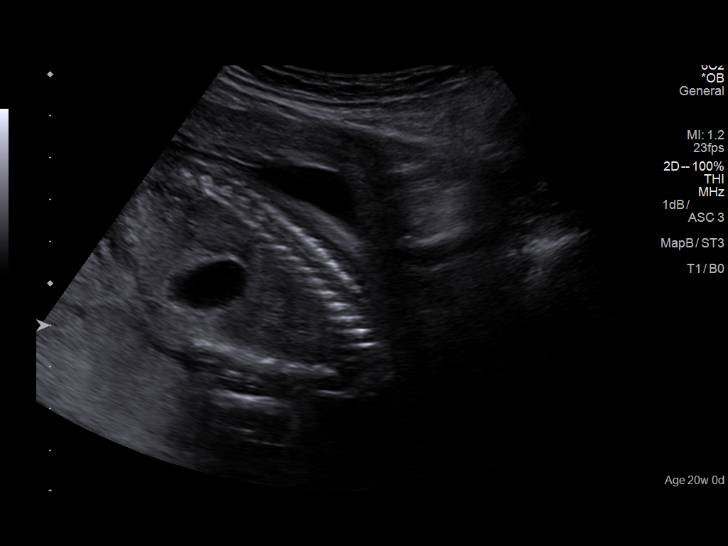
[im 16/27]
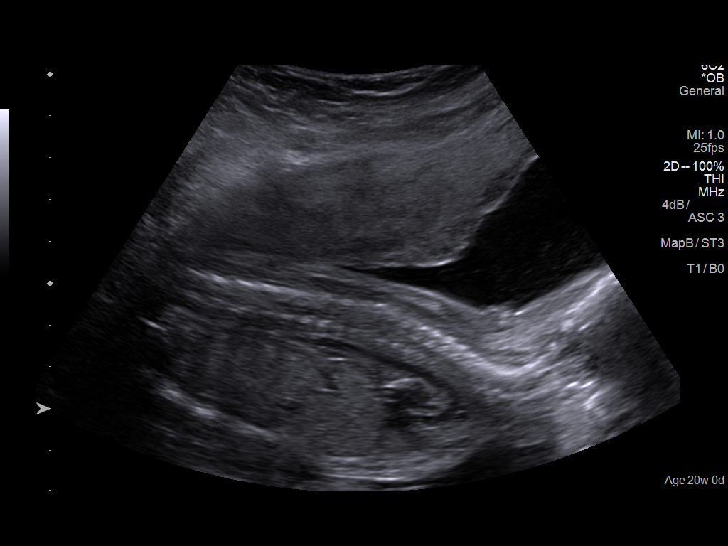
[im 18/27]
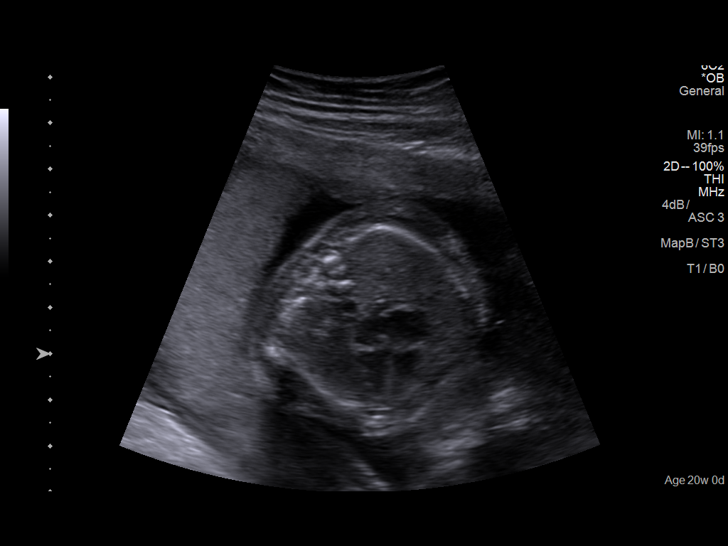
[im 20/27]
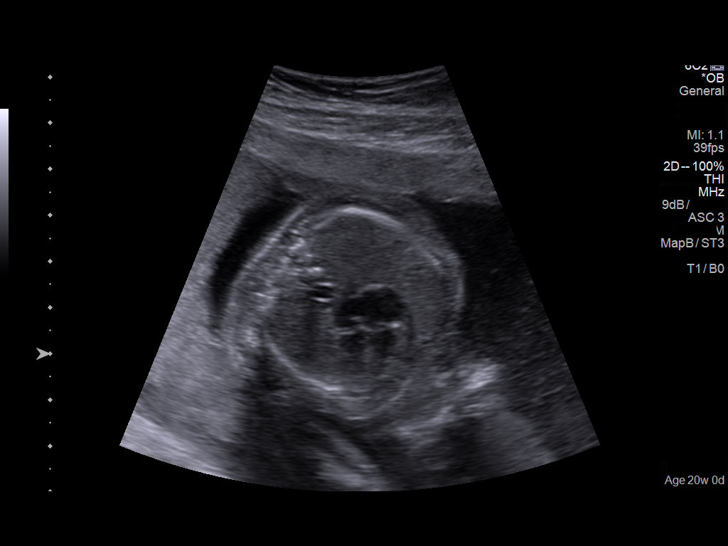
[im 22/27]
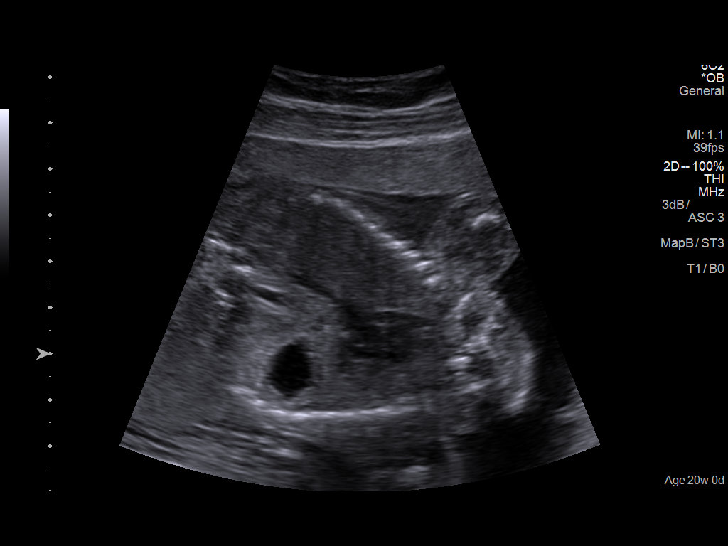
[im 24/27]
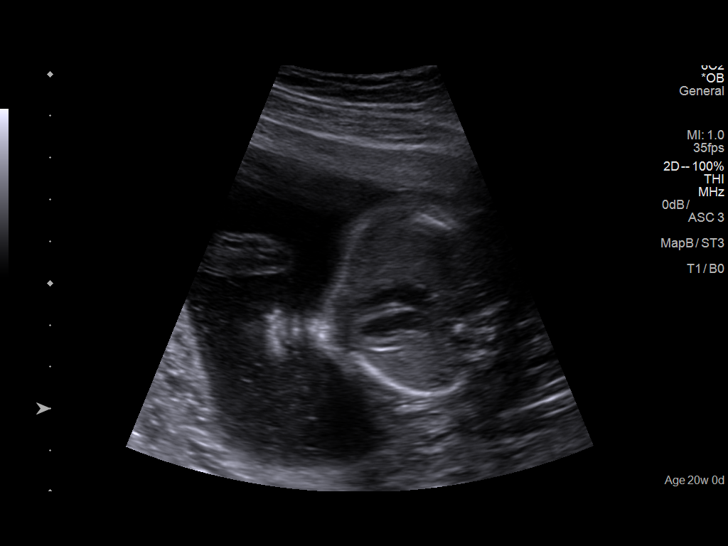
[im 26/27]
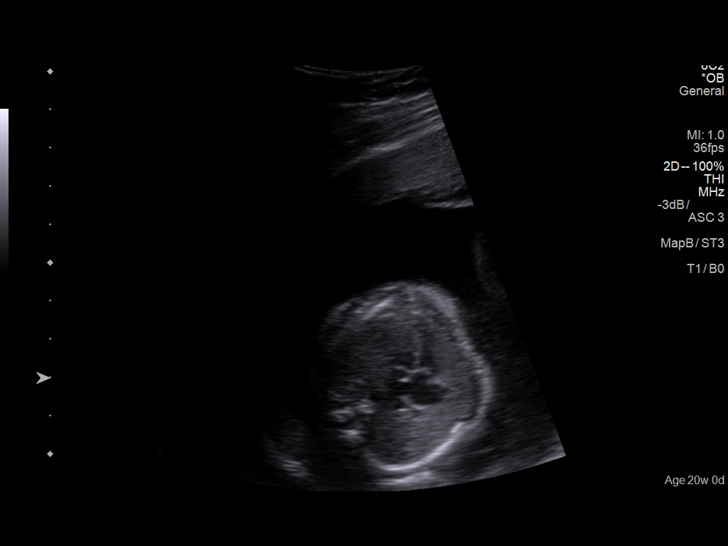

[13 of 27 positions shown; findings below may reference images not displayed]

IMPRESSION: Dear Dr.     Rinku,

Thank you for referring your patient to Admission Perinatal to
obtain views of the fetal anatomy that were suboptimally
visualized on prior examination done for abnormal quad
screen with an increased risk of Down Syndrome of 1/59. Ms
Felicijan has subsequently had a low risk screen for cell free
DNA. She underwent genetic counseling and declined
amniocentesis at a previous visit here.

There is a singleton gestation with subjectively normal
amniotic fluid volume.

The fetal structures suboptimally visualized at the prior
examination were seen today with normal appearance.

It must be noted that a normal ultrasound cannot rule out
aneuploidy.

Follow-up ultrasound is advised as clinically indicated.

Thank you for allowing us to participate in your patient's care.
assistance.

## 2020-05-06 ENCOUNTER — Emergency Department
Admission: EM | Admit: 2020-05-06 | Discharge: 2020-05-06 | Disposition: A | Discharge status: Home / Self Care | Payer: Self-pay | Attending: Emergency Medicine | Admitting: Emergency Medicine

## 2020-05-06 ENCOUNTER — Other Ambulatory Visit: Payer: Self-pay

## 2020-05-06 DIAGNOSIS — L239 Allergic contact dermatitis, unspecified cause: Secondary | ICD-10-CM | POA: Insufficient documentation

## 2020-05-06 DIAGNOSIS — J45909 Unspecified asthma, uncomplicated: Secondary | ICD-10-CM | POA: Insufficient documentation

## 2020-05-06 MED ORDER — PREDNISONE 10 MG PO TABS: 20.0000 mg | ORAL_TABLET | Freq: Every day | ORAL | 0 refills | Status: AC

## 2020-05-06 MED ORDER — PREDNISONE 20 MG PO TABS
40.0000 mg | ORAL_TABLET | Freq: Once | ORAL | Status: AC
  Administered 2020-05-06: 40 mg via ORAL
  Filled 2020-05-06: qty 2

## 2020-05-06 MED ORDER — DIPHENHYDRAMINE HCL 25 MG PO CAPS
50.0000 mg | ORAL_CAPSULE | Freq: Once | ORAL | Status: AC
  Administered 2020-05-06: 50 mg via ORAL
  Filled 2020-05-06: qty 2

## 2020-05-06 NOTE — ED Triage Notes (Signed)
Pt to ED POV for rash that started within the last hour. Rash noted to neck and chest area. No swelling noted to mouth or tongue. Pt denies shob or difficulty swallowing. Denies any new foods, lotions, etc. Reports itching.  Pt in NAD, RR even and unlabored

## 2020-05-06 NOTE — ED Provider Notes (Signed)
[facility] Emergency Department Provider Note   ____________________________________________   Event Date/Time   First MD Initiated Contact with Patient 05/06/20 0206     (approximate)  I have reviewed the triage vital signs and the nursing notes.   HISTORY  Chief Complaint Rash    HPI Mary Kelley is a 25 y.o. female previous history of asthma as well as scoliosis  Patient reports that she was normal today feeling well no fevers no chest pain no cough.  She did her normal workout today the gym and after driving home she noticed that she started feeling itchy around her head scalp arms and back.  She started no she has  slight rash over her face but also just a little bit of swelling around her cheeks   She since reports symptoms seem to be slowly improving.  She did not do anything to improve it.  Has taken no medication.  This never happened before.  No known exposures.  No history of known allergies to anything.  Does take birth control but has been on so for about 1 year.  Did not take or eat anything unusual.  Past Medical History:  Diagnosis Date  . Asthma     Patient Active Problem List   Diagnosis Date Noted  . Labor and delivery indication for care or intervention 08/08/2016  . Risk of fetal chromosome anomaly affecting antepartum care of mother   . Preterm delivery, delivered 08/05/2015  . Retained products of conception after delivery without hemorrhage 08/05/2015    No past surgical history on file.  Prior to Admission medications   Medication Sig Start Date End Date Taking? Authorizing Provider  predniSONE (DELTASONE) 10 MG tablet Take 2 tablets (20 mg total) by mouth daily. 05/06/20  Yes Sharyn Creamer, MD  albuterol (PROVENTIL) (5 MG/ML) 0.5% nebulizer solution Take 2.5 mg by nebulization every 6 (six) hours as needed for wheezing or shortness of breath (2 puffs as needed).    [provider]  medroxyPROGESTERone  (DEPO-PROVERA) 150 MG/ML injection Inject 150 mg into the muscle every 3 (three) months.    [provider]  Prenatal Vit-Fe Fumarate-FA (MULTIVITAMIN-PRENATAL) 27-0.8 MG TABS tablet Take 1 tablet by mouth daily at 12 noon.    [provider]    Allergies Patient has no known allergies.  Family History  Problem Relation Age of Onset  . Hypertension Mother   . Depression Mother     Social History Social History   Tobacco Use  . Smoking status: Never Smoker  . Smokeless tobacco: Never Used  Substance Use Topics  . Alcohol use: No  . Drug use: No    Review of Systems Constitutional: No fever/chills Eyes: No visual changes. ENT: No sore throat.  No swelling in the mouth.  No lip or tongue swelling. Cardiovascular: Denies chest pain. Respiratory: Denies shortness of breath.  No trouble breathing.  No wheezing. Gastrointestinal: No abdominal pain.  No nausea or vomiting.  Denies pregnancy. Musculoskeletal: Negative for back pain except chronic pain due to her scoliosis. Skin: Very small bumps pop up over her head face back and arms, itchy.  If since started to improve.  Swelling over her cheek seems to be going away. Neurological: Negative for headaches, areas of focal weakness or numbness.    ____________________________________________   PHYSICAL EXAM:  VITAL SIGNS: ED Triage Vitals  Enc Vitals Group     BP 05/06/20 0042 (!) 117/91     Pulse Rate 05/06/20  0042 79     Resp 05/06/20 0042 18     Temp 05/06/20 0042 98.1 F (36.7 C)     Temp Source 05/06/20 0042 Oral     SpO2 05/06/20 0042 98 %     Weight 05/06/20 0043 142 lb (64.4 kg)     Height 05/06/20 0043 5\' 6"  (1.676 m)     Head Circumference --      Peak Flow --      Pain Score 05/06/20 0043 0     Pain Loc --      Pain Edu? --      Excl. in GC? --     Constitutional: Alert and oriented. Well appearing and in no acute distress. Eyes: Conjunctivae are normal. Head: Atraumatic. Nose: No  congestion/rhinnorhea. Mouth/Throat: Mucous membranes are moist.  Oropharynx widely patent.  No angioedema of the oropharynx or tongue.  Speaks normally full clear sentences with clear voice. Neck: No stridor.  Cardiovascular: Normal rate, regular rhythm. Grossly normal heart sounds.  Good peripheral circulation. Respiratory: Normal respiratory effort.  No retractions. Lungs CTAB. Gastrointestinal: Soft and nontender. No distention. Musculoskeletal: No lower extremity tenderness nor edema. Neurologic:  Normal speech and language. No gross focal neurologic deficits are appreciated.  Skin:  Skin is warm, dry and intact. No rash noted except she has some very slight very minimal appear to be urticaria still over the upper back and slightly over the cheekbones bilateral without notable edema or swelling. Psychiatric: Mood and affect are normal. Speech and behavior are normal.  ____________________________________________   LABS (all labs ordered are listed, but only abnormal results are displayed)  Labs Reviewed - No data to display ____________________________________________  EKG   ____________________________________________  RADIOLOGY   ____________________________________________   PROCEDURES  Procedure(s) performed: None  Procedures  Critical Care performed: No  ____________________________________________   INITIAL IMPRESSION / ASSESSMENT AND PLAN / ED COURSE  Pertinent labs & imaging results that were available during my care of the patient were reviewed by me and considered in my medical decision making (see chart for details).   No evidence anaphylaxis.  Suspect some type of acute dermatitis or mild allergic reaction.  Discussed with the patient will treat with Benadryl and prednisone, offered observation after administration of medications in the ER but patient reports she would like to build to be discharged and can continue prednisone at home.  I see no evidence  of systemic reaction to suggest anaphylaxis, no signs or symptoms of infectious etiology or angioedema.  Very stable reassuring examination.  Discussed careful return precautions and will provide patient with Benadryl and prednisone here, sister driving her home.  And she will continue prednisone for a couple more days.  Recommended follow-up with [facility] for potential allergy testing if this continues to occur.  Patient agreement.  Return precautions and treatment recommendations and follow-up discussed with the patient who is agreeable with the plan.       ____________________________________________   FINAL CLINICAL IMPRESSION(S) / ED DIAGNOSES  Final diagnoses:  Allergic dermatitis        Note:  This document was prepared using Dragon voice recognition software and may include unintentional dictation errors       05/08/20, MD 05/06/20 0231

## 2020-06-06 ENCOUNTER — Emergency Department: Payer: Medicaid Other

## 2020-06-06 ENCOUNTER — Other Ambulatory Visit: Payer: Self-pay

## 2020-06-06 ENCOUNTER — Emergency Department
Admission: EM | Admit: 2020-06-06 | Discharge: 2020-06-06 | Disposition: A | Discharge status: Home / Self Care | Payer: Medicaid Other | Attending: Emergency Medicine | Admitting: Emergency Medicine

## 2020-06-06 ENCOUNTER — Encounter: Payer: Self-pay | Admitting: Emergency Medicine

## 2020-06-06 DIAGNOSIS — J45909 Unspecified asthma, uncomplicated: Secondary | ICD-10-CM | POA: Insufficient documentation

## 2020-06-06 DIAGNOSIS — M25511 Pain in right shoulder: Secondary | ICD-10-CM | POA: Insufficient documentation

## 2020-06-06 DIAGNOSIS — Y93B9 Activity, other involving muscle strengthening exercises: Secondary | ICD-10-CM | POA: Insufficient documentation

## 2020-06-06 DIAGNOSIS — Y9239 Other specified sports and athletic area as the place of occurrence of the external cause: Secondary | ICD-10-CM | POA: Insufficient documentation

## 2020-06-06 DIAGNOSIS — X500XXA Overexertion from strenuous movement or load, initial encounter: Secondary | ICD-10-CM | POA: Insufficient documentation

## 2020-06-06 HISTORY — DX: Scoliosis, unspecified: M41.9

## 2020-06-06 MED ORDER — MELOXICAM 15 MG PO TABS: 15.0000 mg | ORAL_TABLET | Freq: Every day | ORAL | 2 refills | Status: AC

## 2020-06-06 NOTE — ED Provider Notes (Signed)
[facility]-EMERGENCY DEPARTMENT  ____________________________________________  Time seen: Approximately 11:26 PM  I have reviewed the triage vital signs and the nursing notes.   HISTORY  Chief Complaint Shoulder Pain   Historian Patient     HPI Mary Kelley is a 25 y.o. female presents to the emergency department with acute right shoulder pain.  Patient states that she has been weightlifting in the gym and she thinks she overdid it.  She denies falls or mechanisms of trauma.  No numbness or tingling of the right lower extremity.  Pain is not reproduced with range of motion of the neck.  No alleviating measures have been attempted at home.   Past Medical History:  Diagnosis Date  . Asthma   . Scoliosis      Immunizations up to date:  Yes.     Past Medical History:  Diagnosis Date  . Asthma   . Scoliosis     Patient Active Problem List   Diagnosis Date Noted  . Labor and delivery indication for care or intervention 08/08/2016  . Risk of fetal chromosome anomaly affecting antepartum care of mother   . Preterm delivery, delivered 08/05/2015  . Retained products of conception after delivery without hemorrhage 08/05/2015    Past Surgical History:  Procedure Laterality Date  . HEMORROIDECTOMY      Prior to Admission medications   Medication Sig Start Date End Date Taking? Authorizing Provider  meloxicam (MOBIC) 15 MG tablet Take 1 tablet (15 mg total) by mouth daily. 06/06/20 06/06/21 Yes Pia Mau M, PA-C  albuterol (PROVENTIL) (5 MG/ML) 0.5% nebulizer solution Take 2.5 mg by nebulization every 6 (six) hours as needed for wheezing or shortness of breath (2 puffs as needed).    [provider]  medroxyPROGESTERone (DEPO-PROVERA) 150 MG/ML injection Inject 150 mg into the muscle every 3 (three) months.    [provider]  predniSONE (DELTASONE) 10 MG tablet Take 2 tablets (20 mg total) by mouth daily. 05/06/20   Sharyn Creamer, MD  Prenatal Vit-Fe  Fumarate-FA (MULTIVITAMIN-PRENATAL) 27-0.8 MG TABS tablet Take 1 tablet by mouth daily at 12 noon.    [provider]    Allergies Patient has no known allergies.  Family History  Problem Relation Age of Onset  . Hypertension Mother   . Depression Mother     Social History Social History   Tobacco Use  . Smoking status: Never Smoker  . Smokeless tobacco: Never Used  Substance Use Topics  . Alcohol use: No  . Drug use: No     Review of Systems  Constitutional: No fever/chills Eyes:  No discharge ENT: No upper respiratory complaints. Respiratory: no cough. No SOB/ use of accessory muscles to breath Gastrointestinal:   No nausea, no vomiting.  No diarrhea.  No constipation. Musculoskeletal: Patient has right shoulder pain.  Skin: Negative for rash, abrasions, lacerations, ecchymosis.    ____________________________________________   PHYSICAL EXAM:  VITAL SIGNS: ED Triage Vitals  Enc Vitals Group     BP 06/06/20 2156 108/74     Pulse Rate 06/06/20 2156 63     Resp 06/06/20 2156 20     Temp 06/06/20 2156 98 F (36.7 C)     Temp Source 06/06/20 2156 Oral     SpO2 06/06/20 2156 98 %     Weight 06/06/20 2158 145 lb (65.8 kg)     Height 06/06/20 2158 5\' 6"  (1.676 m)     Head Circumference --      Peak Flow --  Pain Score 06/06/20 2158 6     Pain Loc --      Pain Edu? --      Excl. in GC? --      Constitutional: Alert and oriented. Well appearing and in no acute distress. Eyes: Conjunctivae are normal. PERRL. EOMI. Head: Atraumatic. ENT:      Nose: No congestion/rhinnorhea.      Mouth/Throat: Mucous membranes are moist.  Neck: No stridor.  No cervical spine tenderness to palpation. Cardiovascular: Normal rate, regular rhythm. Normal S1 and S2.  Good peripheral circulation. Respiratory: Normal respiratory effort without tachypnea or retractions. Lungs CTAB. Good air entry to the bases with no decreased or absent breath sounds Gastrointestinal:  Bowel sounds x 4 quadrants. Soft and nontender to palpation. No guarding or rigidity. No distention. Musculoskeletal: Patient performs full range of motion of the right shoulder.  No weakness with right rotator cuff testing. Neurologic:  Normal for age. No gross focal neurologic deficits are appreciated.  Skin:  Skin is warm, dry and intact. No rash noted. Psychiatric: Mood and affect are normal for age. Speech and behavior are normal.   ____________________________________________   LABS (all labs ordered are listed, but only abnormal results are displayed)  Labs Reviewed - No data to display ____________________________________________  EKG   ____________________________________________  RADIOLOGY Geraldo Pitter, personally viewed and evaluated these images (plain radiographs) as part of my medical decision making, as well as reviewing the written report by the radiologist.  DG Shoulder Right  Result Date: 06/06/2020 CLINICAL DATA:  Right shoulder pain which began after working out. EXAM: RIGHT SHOULDER - 2+ VIEW COMPARISON:  None. FINDINGS: There is no evidence of fracture or dislocation. There is no evidence of arthropathy or other focal bone abnormality. Soft tissues are unremarkable. IMPRESSION: Negative. Electronically Signed   By: Kreg Shropshire M.D.   On: 06/06/2020 22:24    ____________________________________________    PROCEDURES  Procedure(s) performed:     Procedures     Medications - No data to display   ____________________________________________   INITIAL IMPRESSION / ASSESSMENT AND PLAN / ED COURSE  Pertinent labs & imaging results that were available during my care of the patient were reviewed by me and considered in my medical decision making (see chart for details).      Assessment and plan Right shoulder pain 25 year old female presents to the emergency department with acute right shoulder pain after exercising at the gym.  X-ray of the  right shoulder shows no bony abnormality.  Likely right rotator cuff tendinitis.  Will start patient on meloxicam once daily for pain and inflammation.  She was advised to follow-up with primary care if symptoms persist.     ____________________________________________  FINAL CLINICAL IMPRESSION(S) / ED DIAGNOSES  Final diagnoses:  Acute pain of right shoulder      NEW MEDICATIONS STARTED DURING THIS VISIT:  ED Discharge Orders         Ordered    meloxicam (MOBIC) 15 MG tablet  Daily        06/06/20 2323              This chart was dictated using voice recognition software/Dragon. Despite best efforts to proofread, errors can occur which can change the meaning. Any change was purely unintentional.     Orvil Feil, PA-C 06/06/20 2330    Gilles Chiquito, MD 06/07/20 [phone]

## 2020-06-06 NOTE — ED Triage Notes (Signed)
Pt presents to triage room ambulatory with steady gait reports right shoulder pain since the morning after she worked out. "I think I over did it with the weight". Pt talks in compete sentences no distress noted

## 2020-06-06 NOTE — Discharge Instructions (Signed)
Take Meloxicam once daily for pain and inflammation.  

## 2022-02-18 IMAGING — CR DG SHOULDER 2+V*R*
1 series · 3 of 3 positions shown · non-contrast
Comparison: None.

CLINICAL DATA: Right shoulder pain which began after working out.

EXAM:
RIGHT SHOULDER - 2+ VIEW

[Series 1: dg shoulder right · 0.14mm/px · 3 of 3 slices shown]
[im 1/3]
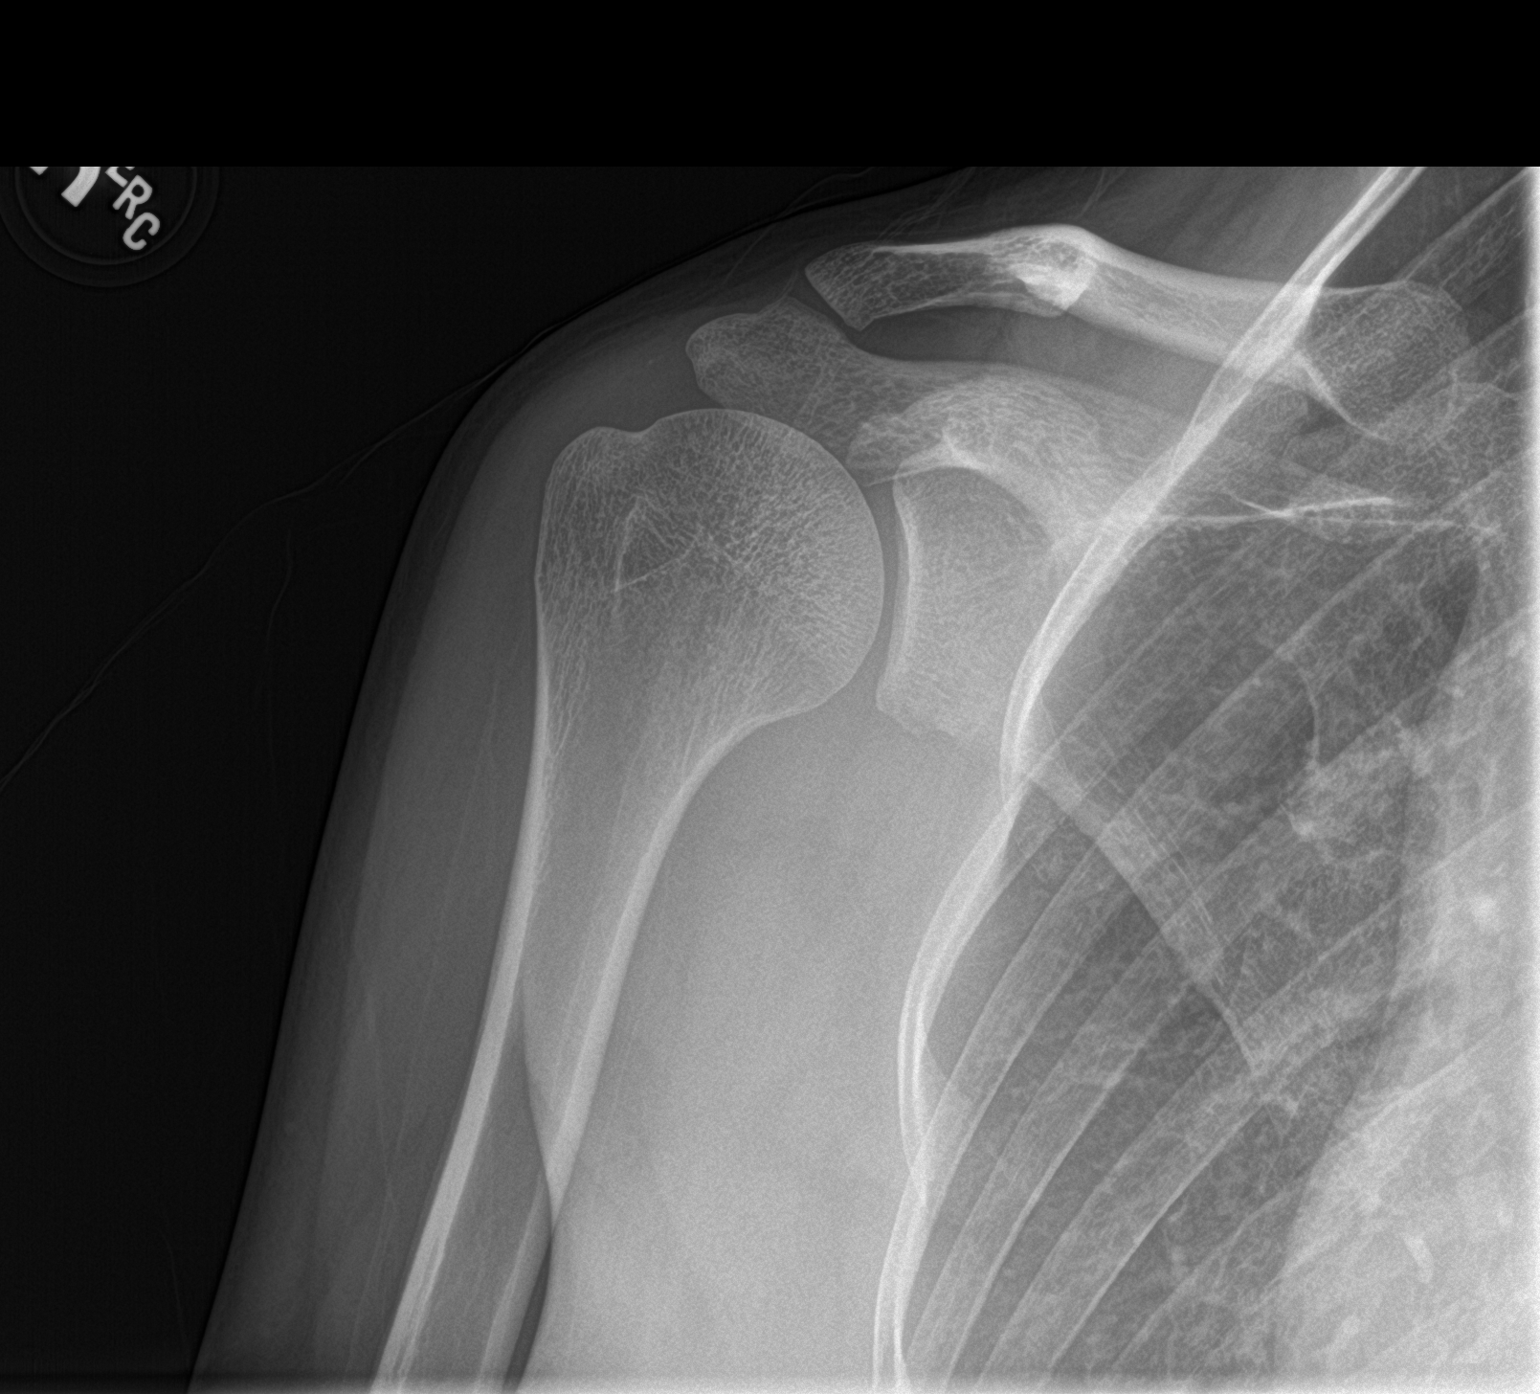
[im 2/3]
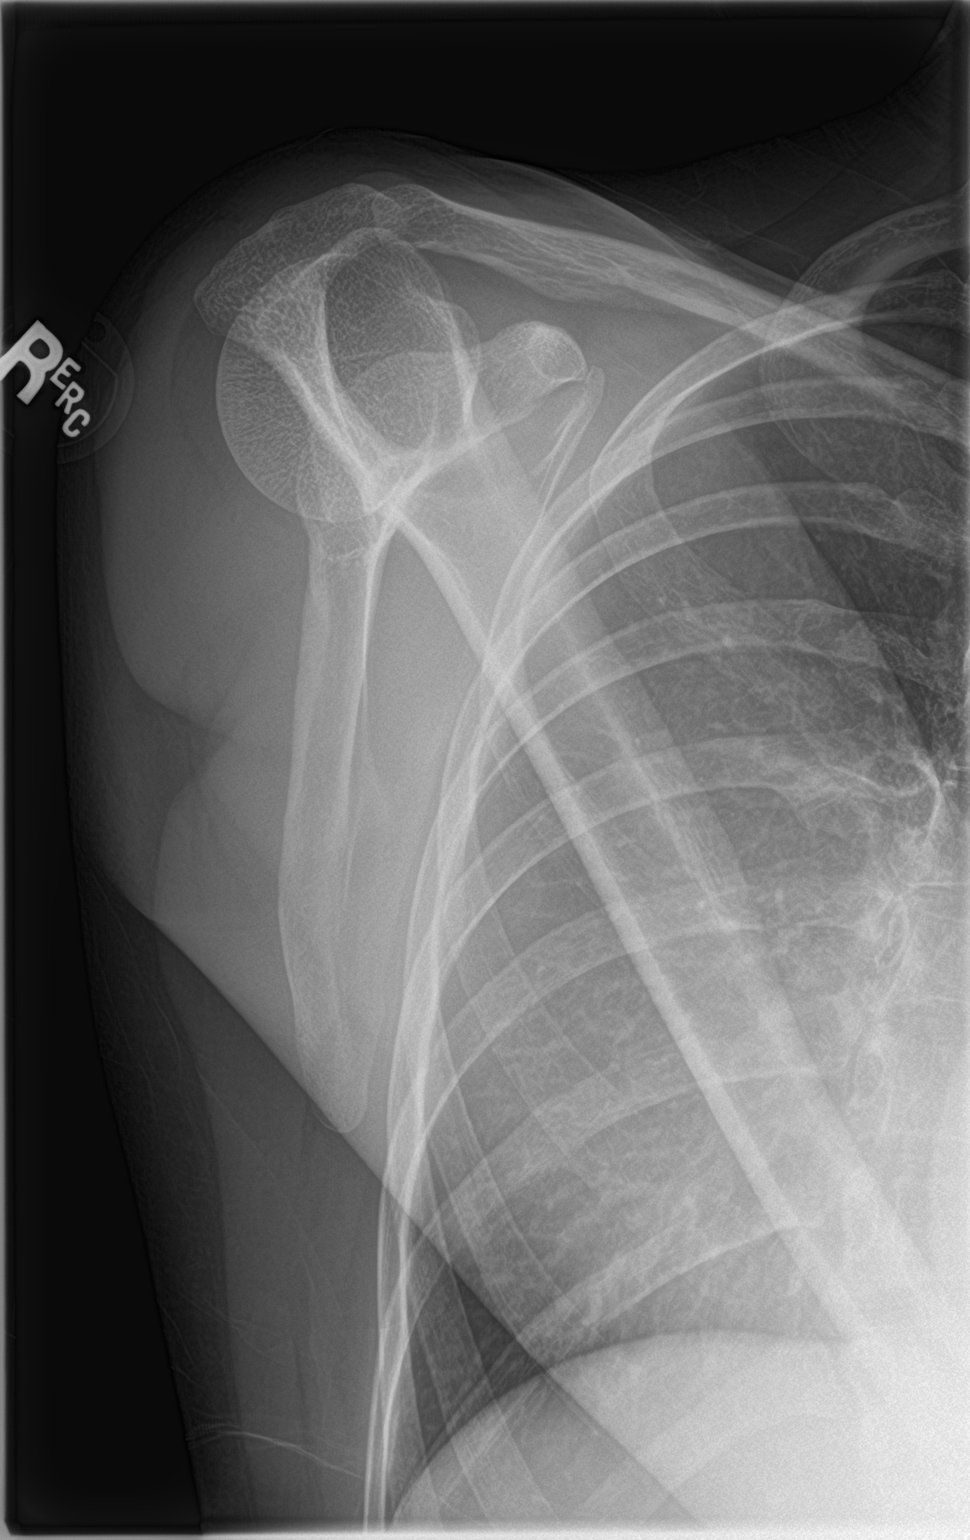
[im 3/3]
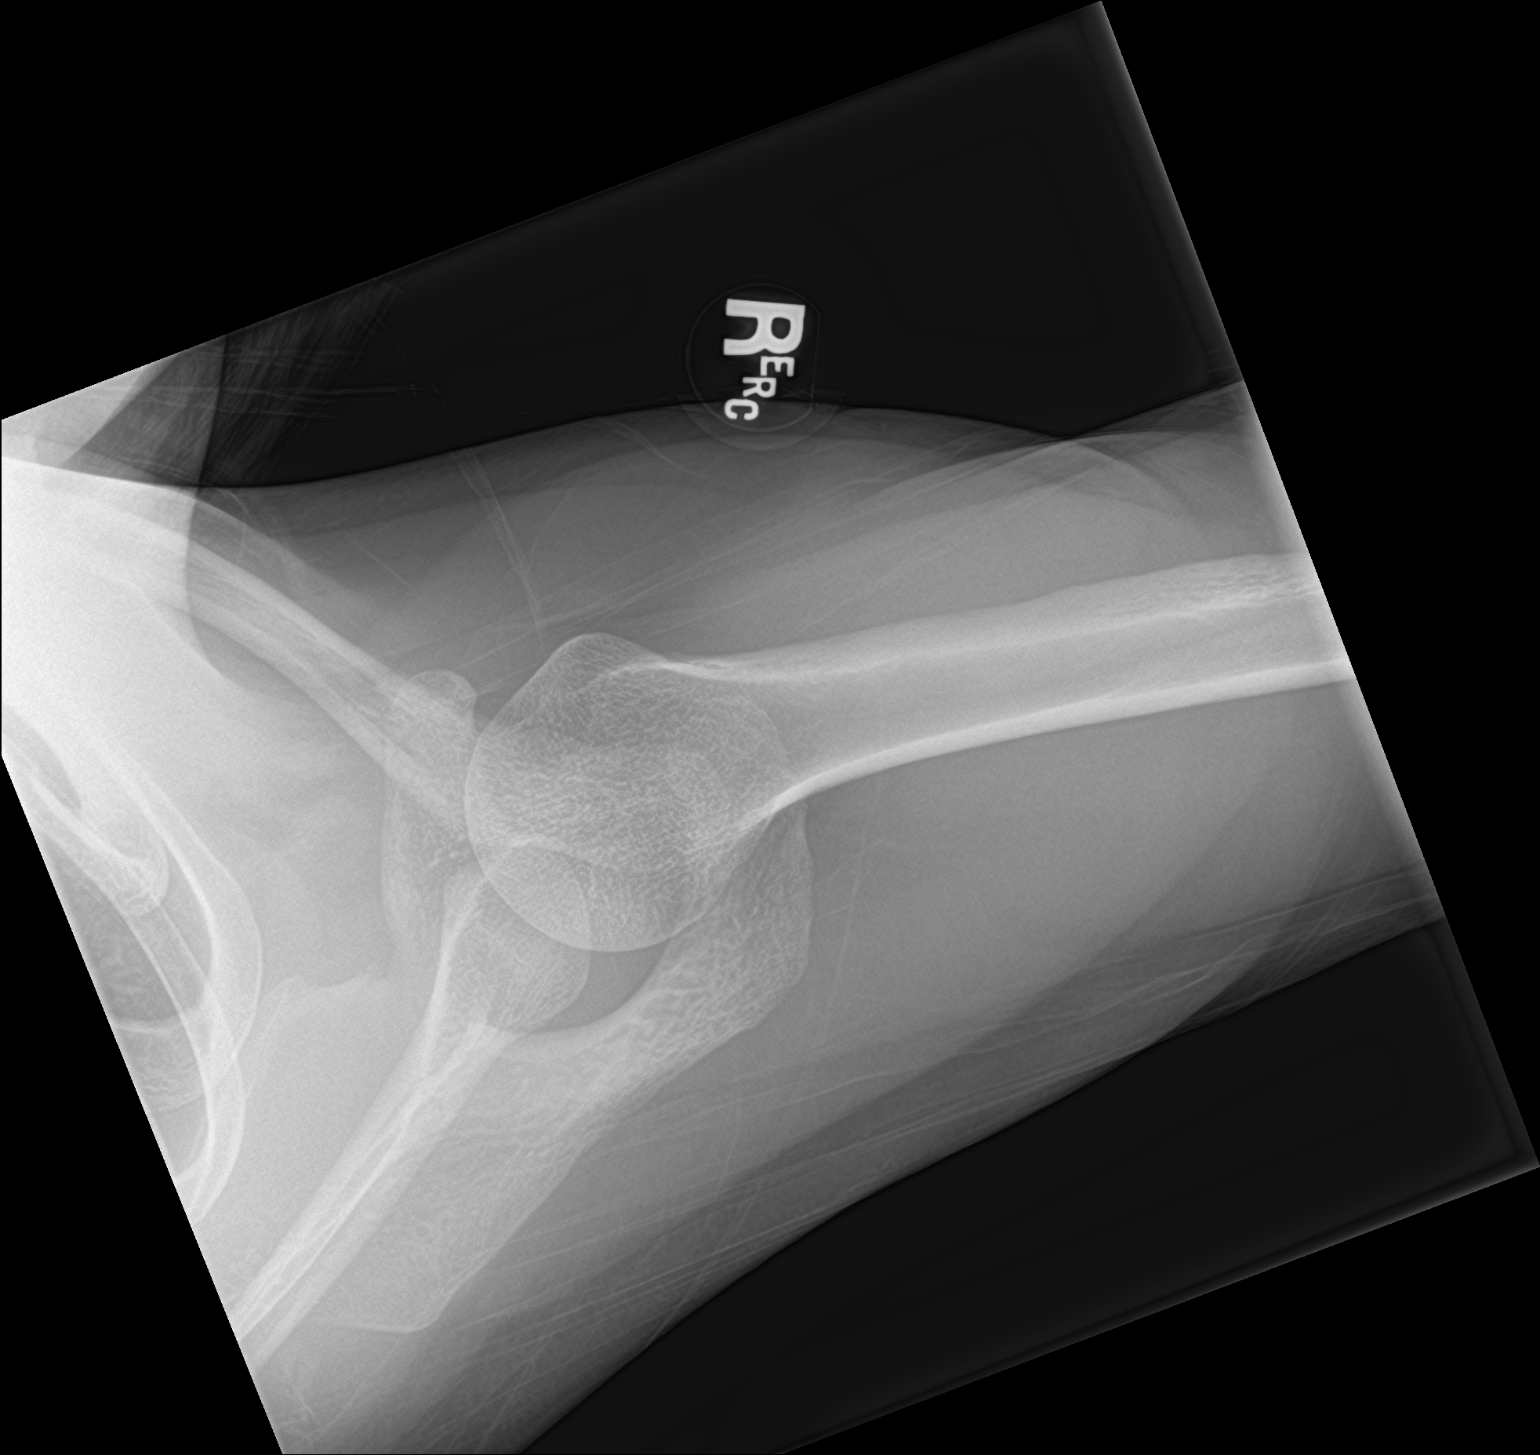

[3 of 3 positions shown; findings below may reference images not displayed]

FINDINGS: There is no evidence of fracture or dislocation. There is no
evidence of arthropathy or other focal bone abnormality. Soft
tissues are unremarkable.
IMPRESSION: Negative.

## 2022-11-14 ENCOUNTER — Other Ambulatory Visit: Payer: Self-pay | Admitting: Chiropractor

## 2022-11-14 ENCOUNTER — Ambulatory Visit
Admission: RE | Admit: 2022-11-14 | Discharge: 2022-11-14 | Disposition: A | Discharge status: Home / Self Care | Payer: Self-pay | Source: Ambulatory Visit | Attending: Chiropractor | Admitting: Chiropractor

## 2022-11-14 DIAGNOSIS — S161XXA Strain of muscle, fascia and tendon at neck level, initial encounter: Secondary | ICD-10-CM

## 2022-11-14 DIAGNOSIS — S29012A Strain of muscle and tendon of back wall of thorax, initial encounter: Secondary | ICD-10-CM

## 2022-11-14 DIAGNOSIS — S39012A Strain of muscle, fascia and tendon of lower back, initial encounter: Secondary | ICD-10-CM
